# Patient Record
Sex: Female | Born: 1980
Health system: Southern US, Community
[De-identification: ages and names within clinical notes are randomized; demographics above are authoritative.]

## PROBLEM LIST (undated history)

## (undated) DIAGNOSIS — R51 Headache: Secondary | ICD-10-CM

## (undated) DIAGNOSIS — IMO0002 Reserved for concepts with insufficient information to code with codable children: Secondary | ICD-10-CM

## (undated) DIAGNOSIS — T7840XA Allergy, unspecified, initial encounter: Secondary | ICD-10-CM

## (undated) DIAGNOSIS — R87619 Unspecified abnormal cytological findings in specimens from cervix uteri: Secondary | ICD-10-CM

## (undated) HISTORY — PX: UPPER GASTROINTESTINAL ENDOSCOPY: SHX188

## (undated) HISTORY — DX: Unspecified abnormal cytological findings in specimens from cervix uteri: R87.619

## (undated) HISTORY — PX: UMBILICAL HERNIA REPAIR: SHX196

## (undated) HISTORY — DX: Allergy, unspecified, initial encounter: T78.40XA

## (undated) HISTORY — DX: Reserved for concepts with insufficient information to code with codable children: IMO0002

## (undated) HISTORY — PX: OTHER SURGICAL HISTORY: SHX169

## (undated) HISTORY — PX: HERNIA REPAIR: SHX51

## (undated) HISTORY — DX: Headache: R51

---

## 2005-11-15 ENCOUNTER — Other Ambulatory Visit: Admission: RE | Admit: 2005-11-15 | Discharge: 2005-11-15 | Payer: Self-pay | Admitting: Gynecology

## 2006-02-25 ENCOUNTER — Encounter: Payer: Self-pay | Admitting: Family Medicine

## 2006-07-19 ENCOUNTER — Emergency Department (HOSPITAL_COMMUNITY): Admission: EM | Admit: 2006-07-19 | Discharge: 2006-07-19 | Payer: Self-pay | Admitting: Emergency Medicine

## 2006-09-21 ENCOUNTER — Ambulatory Visit: Payer: Self-pay | Admitting: Family Medicine

## 2006-11-27 ENCOUNTER — Other Ambulatory Visit: Admission: RE | Admit: 2006-11-27 | Discharge: 2006-11-27 | Payer: Self-pay | Admitting: Gynecology

## 2006-12-05 ENCOUNTER — Ambulatory Visit: Payer: Self-pay | Admitting: Family Medicine

## 2007-03-06 ENCOUNTER — Encounter: Payer: Self-pay | Admitting: Family Medicine

## 2007-04-10 ENCOUNTER — Ambulatory Visit: Payer: Self-pay | Admitting: Family Medicine

## 2007-04-11 LAB — CONVERTED CEMR LAB
ALT: 16 units/L (ref 0–35)
AST: 19 units/L (ref 0–37)
Alkaline Phosphatase: 46 units/L (ref 39–117)
BUN: 15 mg/dL (ref 6–23)
Bilirubin, Direct: 0.1 mg/dL (ref 0.0–0.3)
CO2: 27 meq/L (ref 19–32)
Calcium: 9.5 mg/dL (ref 8.4–10.5)
Chloride: 105 meq/L (ref 96–112)
Cholesterol: 164 mg/dL (ref 0–200)
GFR calc Af Amer: 110 mL/min
Glucose, Bld: 88 mg/dL (ref 70–99)
Total CHOL/HDL Ratio: 2.5
Total Protein: 7.7 g/dL (ref 6.0–8.3)

## 2007-07-16 ENCOUNTER — Ambulatory Visit: Payer: Self-pay | Admitting: Family Medicine

## 2007-07-18 ENCOUNTER — Telehealth: Payer: Self-pay | Admitting: Family Medicine

## 2007-07-19 ENCOUNTER — Encounter: Payer: Self-pay | Admitting: Family Medicine

## 2007-07-19 DIAGNOSIS — R519 Headache, unspecified: Secondary | ICD-10-CM | POA: Insufficient documentation

## 2007-07-19 DIAGNOSIS — R51 Headache: Secondary | ICD-10-CM | POA: Insufficient documentation

## 2007-07-19 DIAGNOSIS — J309 Allergic rhinitis, unspecified: Secondary | ICD-10-CM | POA: Insufficient documentation

## 2007-07-19 DIAGNOSIS — R87619 Unspecified abnormal cytological findings in specimens from cervix uteri: Secondary | ICD-10-CM

## 2008-01-20 ENCOUNTER — Inpatient Hospital Stay (HOSPITAL_COMMUNITY): Admission: AD | Admit: 2008-01-20 | Discharge: 2008-01-22 | Payer: Self-pay | Admitting: Obstetrics and Gynecology

## 2008-01-20 ENCOUNTER — Encounter (INDEPENDENT_AMBULATORY_CARE_PROVIDER_SITE_OTHER): Payer: Self-pay | Admitting: Obstetrics and Gynecology

## 2009-12-16 ENCOUNTER — Ambulatory Visit: Payer: Self-pay | Admitting: Family Medicine

## 2009-12-30 ENCOUNTER — Ambulatory Visit: Payer: Self-pay | Admitting: Family Medicine

## 2010-01-05 ENCOUNTER — Telehealth: Payer: Self-pay | Admitting: Family Medicine

## 2010-01-27 ENCOUNTER — Ambulatory Visit: Payer: Self-pay | Admitting: Family Medicine

## 2010-01-27 DIAGNOSIS — R5381 Other malaise: Secondary | ICD-10-CM | POA: Insufficient documentation

## 2010-01-27 DIAGNOSIS — R5383 Other fatigue: Secondary | ICD-10-CM

## 2010-01-27 DIAGNOSIS — R110 Nausea: Secondary | ICD-10-CM

## 2010-01-28 LAB — CONVERTED CEMR LAB
BUN: 12 mg/dL (ref 6–23)
Basophils Absolute: 0 10*3/uL (ref 0.0–0.1)
Bilirubin, Direct: 0.1 mg/dL (ref 0.0–0.3)
Chloride: 108 meq/L (ref 96–112)
Creatinine, Ser: 0.8 mg/dL (ref 0.4–1.2)
GFR calc non Af Amer: 91.55 mL/min (ref >60)
Glucose, Bld: 67 mg/dL — ABNORMAL LOW (ref 70–99)
HCT: 40.9 % (ref 36.0–46.0)
Lymphs Abs: 1.7 10*3/uL (ref 0.7–4.0)
MCV: 91.9 fL (ref 78.0–100.0)
Monocytes Absolute: 0.3 10*3/uL (ref 0.1–1.0)
Neutrophils Relative %: 60.2 % (ref 43.0–77.0)
Platelets: 394 10*3/uL (ref 150.0–400.0)
Potassium: 4.1 meq/L (ref 3.5–5.1)
RDW: 12.9 % (ref 11.5–14.6)
TSH: 2.36 microintl units/mL (ref 0.35–5.50)
Total Bilirubin: 0.6 mg/dL (ref 0.3–1.2)

## 2010-02-03 ENCOUNTER — Encounter: Payer: Self-pay | Admitting: Family Medicine

## 2010-02-16 ENCOUNTER — Telehealth: Payer: Self-pay | Admitting: Family Medicine

## 2010-06-09 ENCOUNTER — Ambulatory Visit: Payer: Self-pay | Admitting: Family Medicine

## 2010-06-09 DIAGNOSIS — M62838 Other muscle spasm: Secondary | ICD-10-CM

## 2010-06-10 ENCOUNTER — Ambulatory Visit: Payer: Self-pay | Admitting: Unknown Physician Specialty

## 2010-06-10 ENCOUNTER — Encounter: Payer: Self-pay | Admitting: Family Medicine

## 2010-06-13 ENCOUNTER — Encounter: Payer: Self-pay | Admitting: Family Medicine

## 2010-06-21 ENCOUNTER — Ambulatory Visit: Payer: Self-pay | Admitting: Unknown Physician Specialty

## 2010-06-28 ENCOUNTER — Ambulatory Visit: Payer: Self-pay | Admitting: Unknown Physician Specialty

## 2010-08-28 NOTE — L&D Delivery Note (Signed)
Delivery Note Pt progressed rapidly to complete dilation after epidural and pushed well. At 12:45 PM a healthy female was delivered via  (Presentation: ; Occiput Anterior).  APGAR: 9, 9; weight 8 lb 5 oz (3771 g).   Placenta status: Intact, Spontaneous.  Cord: 3 vessels with the following complications: .   Anesthesia: Epidural  Episiotomy: None Lacerations: 2nd degree Suture Repair: 3.0 vicryl rapide Est. Blood Loss (mL): 300  Mom to postpartum.  Baby to nursery-stable.  Teresa Parrish 05/22/2011, 1:12 PM

## 2010-09-27 NOTE — Procedures (Signed)
Summary: Upper GI Endoscopy by Dr.[facility]  Upper GI Endoscopy by Dr.[facility]   Imported By: Beau Fanny 06/15/2010 16:30:56  _____________________________________________________________________  External Attachment:    Type:   Image     Comment:   External Document

## 2010-09-27 NOTE — Consult Note (Signed)
Summary: [facility]  [facility]   Imported By: Lanelle Bal 04/05/2010 09:26:05  _____________________________________________________________________  External Attachment:    Type:   Image     Comment:   External Document

## 2010-09-27 NOTE — Assessment & Plan Note (Signed)
Summary: NAUSEA/CLE   Vital Signs:  Patient profile:   30 year old female Weight:      117.75 pounds BMI:     18.79 Temp:     98.2 degrees F oral Pulse rate:   88 / minute Pulse rhythm:   regular BP sitting:   102 / 72  (left arm) Cuff size:   regular  Vitals Entered By: Sydell Axon LPN (December 16, 2009 2:37 PM) CC: Headaches in the morning and nausea, had taken a pregnancy test which was negative   History of Present Illness: Pt here for nausea for the last month. Sometimes after eating, sometimes later. Actually has been happening 3-4 mos, at first occas, now regularly. Got dizzy Sat and decided to call. No event related temporally that she can remember. Sxs actually upset stomach. No diarrhea, no constipation. She did pregnancy test yesterday, was neg. It sometimes is right after eating, sometimes 1-2 hrs later. Seems to have no relation to what is eaten.  No problems in the family with nausea. Not on any medications. Under no significant stress. 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 Has some headaches in the morning...have been bad, typically goes away after an hour, then eats and then nausea.  Did not have brfst this AM and didn't get nausea today. Sister who is a Teacher, early years/pre wonders if pt having reflux. Then pt says not stomach pain just uneasy feeling in upper abdomen.  Problems Prior to Update: 1)  Abnormal Pap Smear  (ICD-795.00) 2)  Headache  (ICD-784.0) 3)  Allergic Rhinitis  (ICD-477.9) 4)  Uri  (ICD-465.9) 5)  Screening For Lipoid Disorders  (ICD-V77.91)  Medications Prior to Update: 1)  Bl Prenatal Vitamins   Tabs (Prenatal Vit-Fe Fumarate-Fa) .... Take 1 Tablet By Mouth Once A Day 2)  Amoxicillin 500 Mg  Caps (Amoxicillin) .... 2 Tab By Mouth Two Times A Day X 10 Days 3)  Clarinex 5 Mg  Tabs (Desloratadine) .... Take 1 Tablet By Mouth Once A Day  Allergies: No Known Drug Allergies  Physical  Exam  General:  Well-developed,well-nourished, thin in no acute distress; alert,appropriate and cooperative throughout examination Head:  Normocephalic and atraumatic without obvious abnormalities. Sinuse NT. Eyes:  No corneal or conjunctival inflammation noted. EOMI. Perrla. Vision grossly normal. Ears:  External ear exam shows no significant lesions or deformities.  Otoscopic examination reveals clear canals, tympanic membranes are intact bilaterally without bulging, retraction, inflammation or discharge. Hearing is grossly normal bilaterally. Nose:  External nasal examination shows no deformity or inflammation. Nasal mucosa are pink and moist without lesions or exudates. Mouth:  Oral mucosa and oropharynx without lesions or exudates.  Teeth in good repair. Lungs:  Normal respiratory effort, chest expands symmetrically. Lungs are clear to auscultation, no crackles or wheezes. Heart:  Normal rate and regular rhythm. S1 and S2 normal without gallop, murmur, click, rub or other extra sounds. Abdomen:  Bowel sounds positive,abdomen soft and non-tender without masses, organomegaly or hernias noted. Significantly tympanitic in upper quadrants bilat , less so in RLQ.   Impression & Recommendations:  Problem # 1:  NAUSEA (ICD-787.02) Assessment New  Question of frank nausea or dyspepsia without vomiting, diarrhea or other GI complaint. Avoid milk and milk products, avoid nicotine, caffiene, tomato products and spicy foods. Eat smaller meals regularly. Don't eat within 3 hrs of going to bed. RTC 2 weeks to reassess. 30 mins spent with pt. The following medications were removed from  the medication list:    Clarinex 5 Mg Tabs (Desloratadine) .Marland Kitchen... Take 1 tablet by mouth once a day  Complete Medication List: 1)  Bl Prenatal Vitamins Tabs (Prenatal vit-fe fumarate-fa) .... Take 1 tablet by mouth once a day  Patient Instructions: 1)  RTC 2 weeks.  Current Allergies (reviewed today): No known  allergies

## 2010-09-27 NOTE — Progress Notes (Signed)
Summary: omeprazole  Phone Note Call from Patient Call back at Home Phone [phone]   Caller: Patient Call For: Kerby Nora MD Summary of Call: Patient is asking for a rx for Omeprazole 40mg  [facility] [place]. She says that it is less expensive through with rx.  Initial call taken by: Melody Comas,  Jan 05, 2010 8:46 AM  Follow-up for Phone Call        Patient notified of rx.  Follow-up by: Melody Comas,  Jan 05, 2010 9:10 AM    New/Updated Medications: OMEPRAZOLE 40 MG CPDR (OMEPRAZOLE) one tab by mouth 45 mins prior to brfst. Prescriptions: OMEPRAZOLE 40 MG CPDR (OMEPRAZOLE) one tab by mouth 45 mins prior to brfst.  #30 x 1   Entered and Authorized by:   Shaune Leeks MD   Signed by:   Shaune Leeks MD on 01/05/2010   Method used:   [facility] [phone]* (retail)       [address]       [place], [place]  [address]       Ph: [phone]       Fax: [phone]   RxID:   [id number]

## 2010-09-27 NOTE — Assessment & Plan Note (Signed)
Summary: 2WK FOLLOW UP / LFW   Vital Signs:  Patient profile:   30 year old female Weight:      115.75 pounds Temp:     98.7 degrees F oral Pulse rate:   72 / minute Pulse rhythm:   regular BP sitting:   112 / 78  (left arm) Cuff size:   regular  Vitals Entered By: Sydell Axon LPN (Dec 30, 1476 12:15 PM) CC: 2 Week follow-up on nausea   History of Present Illness: Pt here for 2 week followup of nausea and dyspepsia without vomiting that I asked her to avoid milk and milk products for, which she did and improved until she liberalized. She now continues to have nausea, typically in the mid to late afternoon. She otherwise feels well and has no overt pain.  Problems Prior to Update: 1)  Nausea  (ICD-787.02) 2)  Abnormal Pap Smear  (ICD-795.00) 3)  Headache  (ICD-784.0) 4)  Allergic Rhinitis  (ICD-477.9) 5)  Uri  (ICD-465.9) 6)  Screening For Lipoid Disorders  (ICD-V77.91)  Medications Prior to Update: 1)  Bl Prenatal Vitamins   Tabs (Prenatal Vit-Fe Fumarate-Fa) .... Take 1 Tablet By Mouth Once A Day  Allergies: No Known Drug Allergies  Physical Exam  General:  Well-developed,well-nourished, thin in no acute distress; alert,appropriate and cooperative throughout examination Head:  Normocephalic and atraumatic without obvious abnormalities. Sinuse NT. Eyes:  No corneal or conjunctival inflammation noted. EOMI. Perrla. Vision grossly normal. Ears:  External ear exam shows no significant lesions or deformities.  Otoscopic examination reveals clear canals, tympanic membranes are intact bilaterally without bulging, retraction, inflammation or discharge. Hearing is grossly normal bilaterally. Nose:  External nasal examination shows no deformity or inflammation. Nasal mucosa are pink and moist without lesions or exudates. Mouth:  Oral mucosa and oropharynx without lesions or exudates.  Teeth in good repair. Lungs:  Normal respiratory effort, chest expands symmetrically. Lungs are  clear to auscultation, no crackles or wheezes. Heart:  Normal rate and regular rhythm. S1 and S2 normal without gallop, murmur, click, rub or other extra sounds. Abdomen:  Bowel sounds positive,abdomen soft and non-tender without masses, organomegaly or hernias noted. Less  tympanitic.   Impression & Recommendations:  Problem # 1:  NAUSEA (ICD-787.02) Assessment Improved  Slightly better but was much better without most any milk products. "Do you know how hard it is to avoid ALL milk products? Suggestions per instructions. Discussed symptom control.   Complete Medication List: 1)  Bl Prenatal Vitamins Tabs (Prenatal vit-fe fumarate-fa) .... Take 1 tablet by mouth once a day 2)  Calcium 500 Mg Tabs (Calcium) .... Take one by mouth daily  Other Orders: TD Toxoids IM 7 YR + (29562) Admin 1st Vaccine (13086)  Patient Instructions: 1)  Avoid milk products for two weeks. If no help, avoid other products family is allergic to 2 weeks each. Marland Kitchen 2)  Start Omeprazole 40mg  45 mins after brfst. Do for 2 mos if helps. If not, try pre brfst and supper. If fails, RTC for GI referral. 3)  Tdap today.  Current Allergies (reviewed today): No known allergies    Tetanus/Td Immunization History:    Tetanus/Td # 1:  Td (12/30/2009)  Tetanus/Td Vaccine    Vaccine Type: Td    Site: left deltoid    Mfr: GlaxoSmithKline    Dose: 0.5 ml    Route: IM    Given by: Sydell Axon LPN    Exp. Date: 11/20/2011    Lot #:  [id number]    VIS given: 07/16/07 version given Dec 30, 2009.

## 2010-09-27 NOTE — Assessment & Plan Note (Signed)
Summary: NAUSEA/CLE   Vital Signs:  Patient profile:   30 year old female Height:      66.5 inches Weight:      114.50 pounds BMI:     18.27 Temp:     98.3 degrees F oral Pulse rate:   80 / minute Pulse rhythm:   regular BP sitting:   104 / 76  (right arm) Cuff size:   regular  Vitals Entered By: Linde Gillis CMA Duncan Dull) (January 27, 2010 10:43 AM) CC: nausea   History of Present Illness: Pt here for followup of nausea and dyspepsia without vomiting .  She now continues to have nausea, typically in the mid to late afternoon. She otherwise feels well and has no overt pain.  Was seeing Dr. Hetty Ely for this last month. Placed her on omeprazole and asked her to avoid dairy products.  She felt like symptoms have improved mildly but not by much.  This has been going on since December. Assocated with her postpradial nausea is non specific abdominal discomfort/epigastric burning, fatigue, and anxiety.  She is not more stressed in general but gets very anxious when she thinks about what could be wrong with her, becomes tearful and short of breath. NO SI or HI.  No changes in her bowels.  No constipation, diarrhea, blood in stool.  Current Medications (verified): 1)  Calcium 500 Mg Tabs (Calcium) .... Take One By Mouth Daily 2)  Omeprazole 40 Mg Cpdr (Omeprazole) .... One Tab By Mouth 45 Mins Prior To Brfst. 3)  Alprazolam 0.25 Mg Tabs (Alprazolam) .Marland Kitchen.. 1 Tab By Mouth Three Times A Day As Needed Anxiety  Allergies (verified): No Known Drug Allergies  Past History:  Past Medical History: Last updated: 07/19/2007 Unremarkable Allergic rhinitis Headache  Past Surgical History: Last updated: 07/19/2007 2006    CT scan of head (-) Age 24 Hernia, umbilical repair 2005     Colposcopy (-) biopsy  Family History: Last updated: 07/19/2007 Father: Alive 63, healthy Mother: Alive at 2, high chol. 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 Father: Alive 66, healthy Mother: Alive at 67, high chol. Siblings: 1 sister, healthy No MI < 3 Breast CA:  Maternal aunt, age 26's  Social History: Reviewed history from 07/19/2007 and no changes required. 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 ENT:  Denies difficulty swallowing. CV:  Denies chest pain or discomfort. Resp:  Denies shortness of breath. GI:  Complains of abdominal pain and nausea; denies bloody stools, change in bowel habits, vomiting, and yellowish skin color. GU:  Denies discharge and dysuria. MS:  Denies joint pain, joint redness, and joint swelling. Psych:  Complains of anxiety and easily tearful; denies depression, suicidal thoughts/plans, thoughts of violence, and unusual visions or  sounds.  Physical Exam  General:  Well-developed,well-nourished, thin in no acute distress; alert,appropriate and cooperative throughout examination Lungs:  Normal respiratory effort, chest expands symmetrically. Lungs are clear to auscultation, no crackles or wheezes. Heart:  Normal rate and regular rhythm. S1 and S2 normal without gallop, murmur, click, rub or other extra sounds. Abdomen:  Bowel sounds positive,abdomen soft and  non-tender without masses, organomegaly or hernias noted. Psych:  tearful, anxious when discussing her symptoms.   Impression & Recommendations:  Problem # 1:  NAUSEA (ICD-787.02) Assessment Deteriorated Differential is wide and likely multifactorial.  Does seem consistent with dyspepsia/esophagitis but given that symptoms have lasted so long and not improved much with omeprazole, I do think this warrants a GI referral to rule out other causes such as PUD. Associated with fatigue as well, will check labs such as BMET, CBC, TSH, hepatic panel. Given prescription for short term Xanax until we can figure out cause.  Anxiety seems to be playing a component as well. Orders: Gastroenterology Referral (GI)  Problem # 2:  FATIGUE (ICD-780.79)  Orders: Venipuncture ([address]) TLB-BMP (Basic Metabolic Panel-BMET) (80048-METABOL) TLB-CBC Platelet - w/Differential (85025-CBCD) TLB-Hepatic/Liver Function Pnl (80076-HEPATIC) TLB-TSH (Thyroid Stimulating Hormone) (84443-TSH)  Complete Medication List: 1)  Calcium 500 Mg Tabs (Calcium) .... Take one by mouth daily 2)  Omeprazole 40 Mg Cpdr (Omeprazole) .... One tab by mouth 45 mins prior to brfst. 3)  Alprazolam 0.25 Mg Tabs (Alprazolam) .Marland Kitchen.. 1 tab by mouth three times a day as needed anxiety  Patient Instructions: 1)  Please stop by to see Aram Beecham on your way out to set up your GI referral. Prescriptions: ALPRAZOLAM 0.25 MG TABS (ALPRAZOLAM) 1 tab by mouth three times a day as needed anxiety  #30 x 0   Entered and Authorized by:   Ruthe Mannan MD   Signed by:   Ruthe Mannan MD on 01/27/2010   Method used:   Print then Give to Patient   RxID:   [id number]   Current Allergies (reviewed today): No known allergies

## 2010-09-27 NOTE — Progress Notes (Signed)
Summary: needs written script for omeprazole  Phone Note Refill Request Call back at Home Phone [phone] Message from:  Patient  Refills Requested: Medication #1:  OMEPRAZOLE 40 MG CPDR one tab by mouth 45 mins prior to brfst. Pt needs written script for this to mail to mail order pharmacy.  I sent script electronically and then pt said that we need to put certain information on script, such as her BCBS number.  I told her we cant do that electronically, that she can pick up written script and write that info in herself.  Please call when ready.  Initial call taken by: Lowella Petties CMA,  February 16, 2010 3:27 PM  Follow-up for Phone Call        patient advised rx ready for pick up Follow-up by: Benny Lennert CMA Duncan Dull),  February 17, 2010 8:38 AM    Prescriptions: OMEPRAZOLE 40 MG CPDR (OMEPRAZOLE) one tab by mouth 45 mins prior to brfst.  #90 x 2   Entered and Authorized by:   [place] Beat MD   Signed by:   [place] Beat MD on 02/16/2010   Method used:   Print then Give to Patient   RxID:   [id number]

## 2010-09-27 NOTE — Procedures (Signed)
Summary: Colonoscopy by Dr.Paul [facility]  Colonoscopy by Dr.Paul Oh,[facility]   Imported By: Beau Fanny 06/15/2010 16:36:02  _____________________________________________________________________  External Attachment:    Type:   Image     Comment:   External Document

## 2010-09-27 NOTE — Assessment & Plan Note (Signed)
Summary: HA/CLE   Vital Signs:  Patient profile:   30 year old female Height:      66.5 inches Weight:      113 pounds BMI:     18.03 Temp:     98.3 degrees F oral Pulse rate:   72 / minute Pulse rhythm:   regular BP sitting:   110 / 78  (right arm) Cuff size:   regular  Vitals Entered By: Linde Gillis CMA Duncan Dull) (June 09, 2010 12:20 PM) CC: headaches   History of Present Illness: Pt here for 2 months of headaches. Headaches occur daily, usually in back of head, sometimes frontal.  No associated with photophobia but does sometimes occur around her eyes and can "see spots."  No focal neurological deficts.  Headaches are dull and constant.  Takes Tylenol for them because being follow for epigastric pain and nausea and told not to take NSAIDs  Under tremendous stress, has been dealing with this daily nausea and epigastric discomfort.  Scheduled for endoscopy tomorrow and is very concerned there is something very wrong with her.    No changes in her bowels.  No constipation, diarrhea, blood in stool.  Weight stable since June.  Current Medications (verified): 1)  Cyclobenzaprine Hcl 10 Mg  Tabs (Cyclobenzaprine Hcl) .Marland Kitchen.. 1 By Mouth 2 Times Daily As Needed For Headache or Back Pain.  Allergies (verified): No Known Drug Allergies  Past History:  Past Medical History: Last updated: 07/19/2007 Unremarkable Allergic rhinitis Headache  Past Surgical History: Last updated: 07/19/2007 2006    CT scan of head (-) Age 30 Hernia, umbilical repair 2005     Colposcopy (-) biopsy  Family History: Last updated: 07/19/2007 Father: Alive 37, healthy Mother: Alive at 51, high chol. 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 Eyes:  Denies light sensitivity and vision loss-both eyes. ENT:  Denies decreased hearing. GI:  Complains of abdominal pain and nausea; denies bloody stools, change in bowel habits, and vomiting. Neuro:  Complains of headaches; denies inability to speak, seizures, sensation of room spinning, tingling, tremors, visual disturbances, and weakness.  Physical Exam  General:  Well-developed,well-nourished, thin in no acute distress; alert,appropriate and cooperative throughout examination Head:  Normocephalic and atraumatic without obvious abnormalities. Sinuse NT. Eyes:  No corneal or conjunctival inflammation noted. EOMI. Perrla. Vision grossly normal. Mouth:  Oral mucosa and oropharynx without lesions or exudates.  Teeth in good repair. Neck:  trapezius muscle very firm and TTP Lungs:  Normal respiratory effort, chest expands symmetrically. Lungs are clear to auscultation, no crackles or wheezes. Heart:  Normal rate and regular rhythm. S1 and S2 normal without gallop, murmur, click, rub or other extra sounds. Neurologic:  No cranial nerve deficits noted. Station and gait are normal. Plantar reflexes are down-going bilaterally. DTRs are symmetrical throughout. Sensory, motor and coordinative functions appear intact. Psych:  tearful, anxious when discussing her symptoms.   Impression & Recommendations:  Problem # 1:  HEADACHE (ICD-784.0) Assessment New Most consistent with tension headache given history and  physical exam findings.  Not tyipcal of migraine although ahd some "spots" in her vision once which resolved.  Has muscle tightness/spasm of trapezius muscle which is likely contributing.  See below.    Problem # 2:  MUSCLE SPASM, TRAPEZIUS  (ICD-728.85) Assessment: New Advised massage therapy, flexeril as needed.  Hopefully once she has results from endoscopy, stress level will improve.    Complete Medication List: 1)  Cyclobenzaprine Hcl 10 Mg Tabs (Cyclobenzaprine hcl) .Marland Kitchen.. 1 by mouth 2 times daily as needed for headache or back pain.  Patient Instructions: 1)  Massage therapy. 2)  Good luck tomorrow, let me know how it goes. Prescriptions: CYCLOBENZAPRINE HCL 10 MG  TABS (CYCLOBENZAPRINE HCL) 1 by mouth 2 times daily as needed for headache or back pain.  #30 x 0   Entered and Authorized by:   Ruthe Mannan MD   Signed by:   Ruthe Mannan MD on 06/09/2010   Method used:   [facility]        [facility] [phone]* (retail)       [address]       [place], [place]  [address]       Ph: [phone]       Fax: [phone]   RxID:   [id number]   Current Allergies (reviewed today): No known allergies

## 2010-10-12 ENCOUNTER — Ambulatory Visit (INDEPENDENT_AMBULATORY_CARE_PROVIDER_SITE_OTHER): Payer: BC Managed Care – PPO | Admitting: Family Medicine

## 2010-10-12 ENCOUNTER — Encounter: Payer: Self-pay | Admitting: Family Medicine

## 2010-10-12 DIAGNOSIS — J018 Other acute sinusitis: Secondary | ICD-10-CM | POA: Insufficient documentation

## 2010-10-19 NOTE — Assessment & Plan Note (Signed)
Summary: COUGH,ST/CLE   BCBS   Vital Signs:  Patient profile:   30 year old female Height:      66.5 inches Weight:      120.25 pounds BMI:     19.19 Temp:     98.2 degrees F oral Pulse rate:   72 / minute Pulse rhythm:   regular BP sitting:   110 / 80  (right arm) Cuff size:   regular  Vitals Entered By: Linde Gillis CMA Duncan Dull) (October 12, 2010 12:17 PM) CC: cough, sore throat   History of Present Illness: 30 yo 7 1/[redacted] weeks pregnant here for URI symptoms.  Started 3 weeks ago with cold symptoms- sneezing, runny nose. Thought she was getting better but a few days ago, worsening dry cough and sinus pressure. Felt feverish last night. No wheezing, CP, SOB. No abdominal pain or rashes.    Current Medications (verified): 1)  Keflex 500 Mg Caps (Cephalexin) .Marland Kitchen.. 1 Tab By Mouth Two Times A Day X 7 Days 2)  Cheratussin Ac 100-10 Mg/62ml Syrp (Guaifenesin-Codeine) .... 5 Ml By Mouth Two Times A Day As Needed Cough  Allergies (verified): No Known Drug Allergies  Past History:  Past Medical History: Last updated: 07/19/2007 Unremarkable Allergic rhinitis Headache  Past Surgical History: Last updated: 07/19/2007 2006    CT scan of head (-) Age 10 Hernia, umbilical repair 2005     Colposcopy (-) biopsy  Family History: Last updated: 07/19/2007 Father: Alive 49, healthy Mother: Alive at 55, high chol. 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 ENT:  Complains of nasal congestion,  postnasal drainage, sinus pressure, and sore throat. CV:  Denies chest pain or discomfort. Resp:  Complains of cough; denies shortness of breath, sputum productive, and wheezing.  Physical Exam  General:  Well-developed,well-nourished, thin in no acute distress; alert,appropriate and cooperative throughout examination VSS sounds congested, non toxic appearing Nose:  mucosal erythema.   frontal sinsues TTP Mouth:  pharyngeal erythema.   Lungs:  Normal respiratory effort, chest expands symmetrically. Lungs are clear to auscultation, no crackles or wheezes. Heart:  Normal rate and regular rhythm. S1 and S2 normal without gallop, murmur, click, rub or other extra sounds. Psych:  Cognition and judgment appear intact. Alert and cooperative with normal attention span and concentration. No apparent delusions, illusions, hallucinations   Impression & Recommendations:  Problem # 1:  OTHER ACUTE SINUSITIS (ICD-461.8) Assessment New likely bacterial given duration and progression of symptoms. limited for abx we can use due to pregnancy. Keflex 500 mg two times a day x 7d days. Cheratussin as needed cough. Her updated medication list for this problem includes:    Keflex 500 Mg Caps (Cephalexin) .Marland Kitchen... 1 tab by mouth two times a day x 7 days    Cheratussin Ac 100-10 Mg/33ml Syrp (Guaifenesin-codeine) .Marland KitchenMarland KitchenMarland KitchenMarland Kitchen 5 ml by mouth two times a day as needed cough  Complete Medication List: 1)  Keflex 500 Mg Caps (Cephalexin) .Marland Kitchen.. 1 tab by mouth two times a day x 7 days 2)  Cheratussin Ac 100-10 Mg/48ml Syrp (Guaifenesin-codeine) .... 5 ml by mouth two times a day as needed cough Prescriptions: CHERATUSSIN AC 100-10 MG/5ML SYRP (GUAIFENESIN-CODEINE) 5 ml by mouth two times a day as needed cough  #5 ounces x 0   Entered and Authorized by:   Ruthe Mannan MD   Signed by:   Ruthe Mannan MD on 10/12/2010   Method used:   Print then Give to Patient   RxID:   [id number] KEFLEX 500 MG CAPS (CEPHALEXIN) 1 tab by  mouth two times a day x 7 days  #14 x 0   Entered and Authorized by:   Ruthe Mannan MD   Signed by:   Ruthe Mannan MD on 10/12/2010   Method used:   Electronically to        [facility] [phone]* (retail)       [address]       [place], [place]  [address]       Ph: [phone]       Fax: [phone]   RxID:   [id number]    Orders Added: 1)  Est. Patient Level III [34742]    Prior Medications (reviewed today): None Current Allergies (reviewed today): No known allergies

## 2010-11-03 LAB — GC/CHLAMYDIA PROBE AMP, GENITAL: Gonorrhea: NEGATIVE

## 2010-11-03 LAB — RPR: RPR: NONREACTIVE

## 2010-11-03 LAB — ABO/RH: RH Type: POSITIVE

## 2010-11-03 LAB — ANTIBODY SCREEN: Antibody Screen: NEGATIVE

## 2010-11-03 LAB — HIV ANTIBODY (ROUTINE TESTING W REFLEX): HIV: NONREACTIVE

## 2011-01-10 NOTE — Discharge Summary (Signed)
NAMERICHARD, HOLZ NO.:  [id number]   MEDICAL RECORD NO.:  [id number]          PATIENT TYPE:  INP   LOCATION:  9128                          FACILITY:  [facility]   PHYSICIAN:  Malachi Pro. Ambrose Mantle, M.D. DATE OF BIRTH:  May 05, 1981   DATE OF ADMISSION:  01/20/2008  DATE OF DISCHARGE:  01/22/2008                               DISCHARGE SUMMARY   A 30 year old white married female, para 0, gravida 1, EDC February 01, 2008, admitted with premature rupture of membranes at 1:45 a.m. on Jan 20, 2008.  Blood group and type A+ with a negative antibody, nonreactive  serology, rubella immune, hepatitis B surface antigen negative, HIV  negative, GC and chlamydia negative, declined quad screen, 1-hour  glucola 97, and group B strep positive.  Vaginal ultrasound on June 13, 2007, gestational age [redacted] weeks 6 days, [facility] January 31, 2008, or February 01, 2008.  Ultrasound July 04, 2007, crown-rump length 2.93 cm, [facility] February 01, 2008.  Prenatal care was uncomplicated.  At 1:45 a.m. on the day of  admission, the patient had spontaneous rupture of membranes with clear  fluid at home.  She came here at approximately 3:30 a.m.  Labor did not  begin.  Pitocin was begun at approximately 7:00 a.m.   PAST MEDICAL HISTORY:  No known allergies.  Operations:  In 1992,  umbilical hernia repair.  No illnesses.  Alcohol, tobacco, and drugs  none.   FAMILY HISTORY:  Maternal aunt with breast cancer.  Maternal grandmother  with lymphoma.   On admission, her vital signs were normal.  Heart and lungs were normal.  The abdomen was soft.  Fundal height was 36 cm on Jan 10, 2008.  Fetal  heart tones were normal.  Cervix was 2+ cm, 70%, vertex at -2.  The  patient was placed on penicillin as well as Pitocin by 9:05 a.m., the  contractions were very painful, every 2 minutes.  By the nurse's exam,  the cervix was 3 cm, 70%, vertex at a -1 to -2.  The patient refused  Stadol and wanted an epidural.  She received  an epidural by 9:45 a.m.,  the cervix was 3 cm, 70%, vertex at a -2, contractions were every 2  minutes.  The patient then progressed to full dilatation and pushed  well.  She delivered spontaneously after vacuum assistance was applied.  a living female infant 7 pounds 11 ounces with Apgars of 8 at 1 and 9 at 5  minutes.  Dr. Ambrose Mantle was in attendance.  There was 1 loop of nuchal  cord.  The placenta was intact.  The second-degree midline laceration  was repaired with 3-0 Vicryl.  Rectal was negative.  Uterus was normal.  First, 1% Xylocaine was used for the repair.  Vacuum was applied for the  patient exhaustion with approximately 8 cm of caput showing.  After more  descent and inevitable delivery, the vacuum was removed and the patient  delivered spontaneously.  Blood loss was about 400 mL.  Postpartum, the  patient did well and was discharged on the second  postpartum day.  Initial hemoglobin 39, hematocrit 40.4, white count 10,400, and platelet  count 304,000.  Followup hemoglobin 10.8 and hematocrit 30.5.  RPR was  nonreactive.   FINAL DIAGNOSES:  Intrauterine pregnancy 38 weeks, delivered vertex,  premature rupture of membranes, positive group B strep.   OPERATION:  Vacuum-assisted vaginal delivery and repair of second-degree  midline laceration.   FINAL CONDITION:  Improved.   INSTRUCTIONS:  Include our regular discharge instruction booklet.  Motrin 600 mg, 30 tablets, 1 every 6 hours as needed for pain and  Percocet 5/325, 30 tablets, 1 every 4-6 hours as needed for pain were  given at discharge.  The patient is advised to return in 6 weeks for  followup examination.      Malachi Pro. Ambrose Mantle, M.D.  Electronically Signed     TFH/MEDQ  D:  01/22/2008  T:  01/22/2008  Job:  [id number]

## 2011-01-13 NOTE — Assessment & Plan Note (Signed)
[facility]                           [place] OFFICE NOTE   SHARITA, BIENAIME                       MRN:          [id number]  DATE:09/21/2006                            DOB:          [birth year]    CHIEF COMPLAINT:  30 year old white female here to establish new doctor.   HISTORY OF PRESENT ILLNESS:  Ms. Deamer moved from [place]  approximately 1 year ago. She states that she has been doing well other  than frequent infections over the winter that she was seen in Urgent  Care for. She states that she now has constant post-nasal drip, a  slightly sore throat, and no sneezing, but itchy eyes. She tried  Claritin-D over-the-counter, but noticed no improvement. She has not  taken any allergy medicine in the past. She has never had problems with  allergies before.   REVIEW OF SYSTEMS:  Frequent headaches, no nausea, positive sensitivity  to light and sound. The headaches last 30-40 minutes and happen 1 time  per month. No aura. She uses ibuprofen p.r.n. pain. No glasses. No  hearing problems. No chest pain. No shortness of breath. No  palpitations. No nausea, vomiting, diarrhea, constipation, or rectal  bleeding.   PAST MEDICAL HISTORY:  1. Frequent headaches.  2. 2005 abnormal pap.   HOSPITALIZATIONS/SURGERIES/PROCEDURES:  1. 2006, CAT scan of head, negative.  2. Umbilical hernia repair, age 55.  3. Colposcopy, negative, no biopsy done.  4. Pap smear, 02/2006, negative.   ALLERGIES:  None.   MEDICATIONS:  1. Loestrin birth control pills daily.  2. Multivitamin daily.  3. Ibuprofen 800 mg every 8 hours, p.r.n. headache.   SOCIAL HISTORY:  No tobacco use. She drinks 1 alcoholic beverage, once a  week. No drug use. 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 She has been  married for 1 1/2 years. No verbal or physical abuse. She has no  children. She exercises 3-4 times a week, with running and using the  elliptical. She eats fruits and  vegetables. She tries to avoid fast food  and drinks lots of water.   FAMILY HISTORY:  Father alive at age 67, healthy. Mother alive at age 72  with hypercholesterolemia. She has no family history of MI before age  12. She has 1 sister who is healthy. No family history of diabetes. She  has a maternal aunt who developed breast cancer at age 80. No family  history of colon cancer or other types of cancer.   PHYSICAL EXAMINATION:  VITAL SIGNS: Height 66 1/2 inches, weight 125,  making BMI 20. Blood pressure 98/72, pulse 68, temperature 98.1.  GENERAL: Thin appearing female in no apparent distress.  HEENT: PERRLA, extraocular muscles intact, oral pharynx clear, tympanic  membranes with small amount of clear fluid bilaterally. Nares slightly  pale, turbinates bilaterally. Oral pharynx has no erythema. No  thyromegaly, no lymphadenopathy. Supraclavicular cervical.  CARDIOVASCULAR: Regular rate and rhythm, no murmurs, rubs, or gallops.  Normal PMI. 2+ peripheral pulses. No peripheral edema.  LUNGS: Clear to auscultation bilaterally. No wheezes, rales, or rhonchi.  ABDOMEN: Soft,  nontender, normal active bowel sounds. No hepatomegaly or  splenomegaly.  MUSCULOSKELETAL: Strength 5/5 in the upper and lower extremities.  NEUROLOGIC: Cranial nerves 2-12 are grossly intact. Alert and oriented  x3.  REFLEXES: Patellar 2+.   ASSESSMENT AND PLAN:  1. Allergic rhinitis: Her symptoms of constant post-nasal drip, itchy      eyes, and slight sore throat, are most likely secondary to      allergies. She will begin with Clarinex 5 mg daily, which we have      given 10 days of samples for. She will let me know if this improves      her symptoms or not. She may have developed allergies since her      move from [place] to [place].  2. Prevention: She is up-to-date with pap smear. She is unsure of her      last vaccinations, and I will obtain records from her previous      doctor in [place]. She  does not think she has ever had a      cholesterol baseline, so we will order this fasting, along with a C-      met. She was encouraged to continue to eat a healthy and varied      diet, and to get frequent exercise as she is doing. She was also      encouraged to take multivitamin, and to consider calcium 600 mg      daily. She will continue to get birth control pills from her OB-      GYN.     Kerby Nora, MD  Electronically Signed    AB/MedQ  DD: 09/21/2006  DT: 09/22/2006  Job #: [id number]

## 2011-05-02 LAB — STREP B DNA PROBE: GBS: NEGATIVE

## 2011-05-18 ENCOUNTER — Telehealth (HOSPITAL_COMMUNITY): Payer: Self-pay | Admitting: *Deleted

## 2011-05-18 ENCOUNTER — Encounter (HOSPITAL_COMMUNITY): Payer: Self-pay | Admitting: *Deleted

## 2011-05-18 NOTE — Telephone Encounter (Signed)
Preadmission screen  

## 2011-05-21 ENCOUNTER — Other Ambulatory Visit: Payer: Self-pay | Admitting: Obstetrics and Gynecology

## 2011-05-22 ENCOUNTER — Inpatient Hospital Stay (HOSPITAL_COMMUNITY)
Admission: RE | Admit: 2011-05-22 | Discharge: 2011-05-24 | DRG: 373 | Disposition: A | Discharge status: Home / Self Care | Payer: BC Managed Care – PPO | Source: Ambulatory Visit | Attending: Obstetrics and Gynecology | Admitting: Obstetrics and Gynecology

## 2011-05-22 ENCOUNTER — Encounter (HOSPITAL_COMMUNITY): Payer: Self-pay | Admitting: Anesthesiology

## 2011-05-22 ENCOUNTER — Inpatient Hospital Stay (HOSPITAL_COMMUNITY): Payer: BC Managed Care – PPO | Admitting: Anesthesiology

## 2011-05-22 ENCOUNTER — Encounter (HOSPITAL_COMMUNITY): Payer: Self-pay

## 2011-05-22 LAB — CBC
MCH: 30.5 pg (ref 26.0–34.0)
MCHC: 33.7 g/dL (ref 30.0–36.0)
MCV: 90.6 fL (ref 78.0–100.0)
Platelets: 237 10*3/uL (ref 150–400)
RBC: 4.16 MIL/uL (ref 3.87–5.11)

## 2011-05-22 MED ORDER — EPHEDRINE 5 MG/ML INJ
10.0000 mg | INTRAVENOUS | Status: DC | PRN
  Filled 2011-05-22: qty 4

## 2011-05-22 MED ORDER — OXYTOCIN 20 UNITS IN LACTATED RINGERS INFUSION - SIMPLE
1.0000 m[IU]/min | INTRAVENOUS | Status: DC
  Administered 2011-05-22: 2 m[IU]/min via INTRAVENOUS
  Filled 2011-05-22: qty 1000

## 2011-05-22 MED ORDER — LACTATED RINGERS IV SOLN: 500.0000 mL | Freq: Once | INTRAVENOUS | Status: DC

## 2011-05-22 MED ORDER — CITRIC ACID-SODIUM CITRATE 334-500 MG/5ML PO SOLN: 30.0000 mL | ORAL | Status: DC | PRN

## 2011-05-22 MED ORDER — ONDANSETRON HCL 4 MG/2ML IJ SOLN: 4.0000 mg | INTRAMUSCULAR | Status: DC | PRN

## 2011-05-22 MED ORDER — OXYTOCIN 20 UNITS IN LACTATED RINGERS INFUSION - SIMPLE: 125.0000 mL/h | Freq: Once | INTRAVENOUS | Status: DC

## 2011-05-22 MED ORDER — LACTATED RINGERS IV SOLN
INTRAVENOUS | Status: DC
  Administered 2011-05-22: 900 mL via INTRAVENOUS

## 2011-05-22 MED ORDER — OXYCODONE-ACETAMINOPHEN 5-325 MG PO TABS: 1.0000 | ORAL_TABLET | ORAL | Status: DC | PRN

## 2011-05-22 MED ORDER — ACETAMINOPHEN 325 MG PO TABS: 650.0000 mg | ORAL_TABLET | ORAL | Status: DC | PRN

## 2011-05-22 MED ORDER — DIBUCAINE 1 % RE OINT: 1.0000 "application " | TOPICAL_OINTMENT | RECTAL | Status: DC | PRN

## 2011-05-22 MED ORDER — DIPHENHYDRAMINE HCL 50 MG/ML IJ SOLN: 12.5000 mg | INTRAMUSCULAR | Status: DC | PRN

## 2011-05-22 MED ORDER — PRENATAL PLUS 27-1 MG PO TABS
1.0000 | ORAL_TABLET | Freq: Every day | ORAL | Status: DC
  Administered 2011-05-23: 1 via ORAL
  Filled 2011-05-22 (×2): qty 1

## 2011-05-22 MED ORDER — WITCH HAZEL-GLYCERIN EX PADS: 1.0000 "application " | MEDICATED_PAD | CUTANEOUS | Status: DC | PRN

## 2011-05-22 MED ORDER — OXYCODONE-ACETAMINOPHEN 5-325 MG PO TABS: 2.0000 | ORAL_TABLET | ORAL | Status: DC | PRN

## 2011-05-22 MED ORDER — FLEET ENEMA 7-19 GM/118ML RE ENEM: 1.0000 | ENEMA | RECTAL | Status: DC | PRN

## 2011-05-22 MED ORDER — SIMETHICONE 80 MG PO CHEW: 80.0000 mg | CHEWABLE_TABLET | ORAL | Status: DC | PRN

## 2011-05-22 MED ORDER — EPHEDRINE 5 MG/ML INJ
10.0000 mg | INTRAVENOUS | Status: DC | PRN
  Filled 2011-05-22 (×2): qty 4

## 2011-05-22 MED ORDER — TETANUS-DIPHTH-ACELL PERTUSSIS 5-2.5-18.5 LF-MCG/0.5 IM SUSP
0.5000 mL | Freq: Once | INTRAMUSCULAR | Status: AC
  Administered 2011-05-23: 0.5 mL via INTRAMUSCULAR
  Filled 2011-05-22: qty 0.5

## 2011-05-22 MED ORDER — LIDOCAINE HCL 1.5 % IJ SOLN
INTRAMUSCULAR | Status: DC | PRN
  Administered 2011-05-22 (×2): 5 mL via EPIDURAL

## 2011-05-22 MED ORDER — SENNOSIDES-DOCUSATE SODIUM 8.6-50 MG PO TABS
2.0000 | ORAL_TABLET | Freq: Every day | ORAL | Status: DC
  Administered 2011-05-22 – 2011-05-23 (×2): 2 via ORAL

## 2011-05-22 MED ORDER — LACTATED RINGERS IV SOLN
500.0000 mL | INTRAVENOUS | Status: DC | PRN
  Administered 2011-05-22: 1000 mL via INTRAVENOUS
  Administered 2011-05-22: 300 mL via INTRAVENOUS

## 2011-05-22 MED ORDER — ONDANSETRON HCL 4 MG PO TABS: 4.0000 mg | ORAL_TABLET | ORAL | Status: DC | PRN

## 2011-05-22 MED ORDER — ONDANSETRON HCL 4 MG/2ML IJ SOLN: 4.0000 mg | Freq: Four times a day (QID) | INTRAMUSCULAR | Status: DC | PRN

## 2011-05-22 MED ORDER — IBUPROFEN 600 MG PO TABS
600.0000 mg | ORAL_TABLET | Freq: Four times a day (QID) | ORAL | Status: DC
  Administered 2011-05-22 – 2011-05-24 (×7): 600 mg via ORAL
  Filled 2011-05-22 (×6): qty 1

## 2011-05-22 MED ORDER — FENTANYL 2.5 MCG/ML BUPIVACAINE 1/10 % EPIDURAL INFUSION (WH - ANES)
14.0000 mL/h | INTRAMUSCULAR | Status: DC
  Filled 2011-05-22: qty 60

## 2011-05-22 MED ORDER — PHENYLEPHRINE 40 MCG/ML (10ML) SYRINGE FOR IV PUSH (FOR BLOOD PRESSURE SUPPORT)
80.0000 ug | PREFILLED_SYRINGE | INTRAVENOUS | Status: DC | PRN
  Filled 2011-05-22 (×2): qty 5

## 2011-05-22 MED ORDER — PHENYLEPHRINE 40 MCG/ML (10ML) SYRINGE FOR IV PUSH (FOR BLOOD PRESSURE SUPPORT)
80.0000 ug | PREFILLED_SYRINGE | INTRAVENOUS | Status: DC | PRN
  Filled 2011-05-22: qty 5

## 2011-05-22 MED ORDER — LIDOCAINE HCL (PF) 1 % IJ SOLN
30.0000 mL | INTRAMUSCULAR | Status: DC | PRN
  Filled 2011-05-22 (×2): qty 30

## 2011-05-22 MED ORDER — IBUPROFEN 600 MG PO TABS
600.0000 mg | ORAL_TABLET | Freq: Four times a day (QID) | ORAL | Status: DC | PRN
  Filled 2011-05-22: qty 1

## 2011-05-22 MED ORDER — BENZOCAINE-MENTHOL 20-0.5 % EX AERO: 1.0000 "application " | INHALATION_SPRAY | CUTANEOUS | Status: DC | PRN

## 2011-05-22 MED ORDER — OXYTOCIN BOLUS FROM INFUSION
500.0000 mL | Freq: Once | INTRAVENOUS | Status: DC
  Filled 2011-05-22: qty 500

## 2011-05-22 MED ORDER — LANOLIN HYDROUS EX OINT: TOPICAL_OINTMENT | CUTANEOUS | Status: DC | PRN

## 2011-05-22 MED ORDER — ZOLPIDEM TARTRATE 5 MG PO TABS: 5.0000 mg | ORAL_TABLET | Freq: Every evening | ORAL | Status: DC | PRN

## 2011-05-22 MED ORDER — DIPHENHYDRAMINE HCL 25 MG PO CAPS: 25.0000 mg | ORAL_CAPSULE | Freq: Four times a day (QID) | ORAL | Status: DC | PRN

## 2011-05-22 MED ORDER — TERBUTALINE SULFATE 1 MG/ML IJ SOLN: 0.2500 mg | Freq: Once | INTRAMUSCULAR | Status: DC | PRN

## 2011-05-22 MED ORDER — FENTANYL 2.5 MCG/ML BUPIVACAINE 1/10 % EPIDURAL INFUSION (WH - ANES)
INTRAMUSCULAR | Status: DC | PRN
  Administered 2011-05-22: 14 mL/h via EPIDURAL

## 2011-05-22 NOTE — Anesthesia Procedure Notes (Addendum)
Epidural Patient location during procedure: OB Start time: 05/22/2011 10:58 AM End time: 05/22/2011 11:05 AM Reason for block: procedure for pain  Staffing Anesthesiologist: Sandrea Hughs Performed by: anesthesiologist   Preanesthetic Checklist Completed: patient identified, site marked, surgical consent, pre-op evaluation, timeout performed, IV checked, risks and benefits discussed and monitors and equipment checked  Epidural Patient position: sitting Prep: site prepped and draped and DuraPrep Patient monitoring: continuous pulse ox and blood pressure Approach: midline Injection technique: LOR air  Needle:  Needle type: Tuohy  Needle gauge: 17 G Needle length: 9 cm Needle insertion depth: 5 cm cm Catheter type: closed end flexible Catheter size: 19 Gauge Catheter at skin depth: 10 cm Test dose: negative and 1.5% lidocaine  Assessment Events: blood not aspirated, injection not painful, no injection resistance, negative IV test and no paresthesia

## 2011-05-22 NOTE — H&P (Signed)
Teresa Parrish is a 30 y.o. female G2P1001 at 39+weeks (EDD 05/26/11 by 7 week [place]) presenting for induction of labor given term status and favorable cervix.  Prenatal care completely uneventful.   History OB History    Grav Para Term Preterm Abortions TAB SAB Ect Mult Living   2 1 1       1     NSVD 7#11oz 2009  Past Medical History  Diagnosis Date  . Abnormal Pap smear         Resolved  Past Surgical History  Procedure Date  . Upper gastrointestinal endoscopy   . Umbilical hernia repair    Family History: family history includes Cancer in her maternal aunt and maternal grandmother. Social History:  reports that she has never smoked. She does not have any smokeless tobacco history on file. She reports that she does not drink alcohol or use illicit drugs.  ROS  Dilation: 2.5 Effacement (%): 50 Station: -2 Exam by:: Dr Senaida Ores AROM clear  Blood pressure 125/78, pulse 84, temperature 98.7 F (37.1 C), temperature source Oral, resp. rate 20, height 4\' 7"  (1.397 m), weight 74.844 kg (165 lb), last menstrual period 08/15/2010. Exam Physical Exam  Constitutional: She appears well-developed.  Cardiovascular: Normal rate and regular rhythm.   Respiratory: Effort normal and breath sounds normal.  GI: Soft.  Genitourinary: Vagina normal and uterus normal.  Psychiatric: She has a normal mood and affect.    Prenatal labs: ABO, Rh: A/Positive/-- (03/08 0000) Antibody: Negative (03/08 0000) Rubella: Immune (03/08 0000) RPR: Nonreactive (03/08 0000)  HBsAg: Negative (03/08 0000)  HIV: Non-reactive (03/08 0000)  GBS: Negative (09/04 0000)  Declined genetic screenings One hour glucola 114 CF negative  Assessment/Plan: Pt on pitocin and AROM performed.  Plans epidural.  Teresa Parrish 05/22/2011, 9:19 AM

## 2011-05-22 NOTE — Anesthesia Preprocedure Evaluation (Signed)
Anesthesia Evaluation  Name, MR# and DOB Patient awake  General Assessment Comment  Reviewed: Allergy & Precautions, H&P , Patient's Chart, lab work & pertinent test results  Airway Mallampati: II TM Distance: >3 FB Neck ROM: full    Dental No notable dental hx.    Pulmonary  clear to auscultation  pulmonary exam normalPulmonary Exam Normal breath sounds clear to auscultation none    Cardiovascular     Neuro/Psych Negative Psych ROS  GI/Hepatic/Renal negative GI ROS  negative Liver ROS  negative Renal ROS        Endo/Other  Negative Endocrine ROS (+)      Abdominal Normal abdominal exam  (+)   Musculoskeletal negative musculoskeletal ROS (+)   Hematology negative hematology ROS (+)   Peds  Reproductive/Obstetrics (+) Pregnancy    Anesthesia Other Findings             Anesthesia Physical Anesthesia Plan  ASA: III  Anesthesia Plan: Epidural   Post-op Pain Management:    Induction:   Airway Management Planned:   Additional Equipment:   Intra-op Plan:   Post-operative Plan:   Informed Consent: I have reviewed the patients History and Physical, chart, labs and discussed the procedure including the risks, benefits and alternatives for the proposed anesthesia with the patient or authorized representative who has indicated his/her understanding and acceptance.     Plan Discussed with:   Anesthesia Plan Comments:         Anesthesia Quick Evaluation

## 2011-05-23 LAB — CBC
Hemoglobin: 10.7 g/dL — ABNORMAL LOW (ref 12.0–15.0)
MCHC: 34.3 g/dL (ref 30.0–36.0)
RDW: 13.3 % (ref 11.5–15.5)
WBC: 10.6 10*3/uL — ABNORMAL HIGH (ref 4.0–10.5)

## 2011-05-23 MED ORDER — ACETAMINOPHEN 325 MG PO TABS
325.0000 mg | ORAL_TABLET | Freq: Four times a day (QID) | ORAL | Status: DC | PRN
  Administered 2011-05-23 – 2011-05-24 (×3): 325 mg via ORAL
  Filled 2011-05-23 (×3): qty 1

## 2011-05-23 NOTE — Progress Notes (Signed)
Post Partum Day 1 Subjective: no complaints, voiding and tolerating PO  Objective: Blood pressure 100/67, pulse 76, temperature 98 F (36.7 C), temperature source Oral, resp. rate 18, height 4\' 7"  (1.397 m), weight 74.844 kg (165 lb), last menstrual period 08/15/2010, SpO2 99.00%, unknown if currently breastfeeding.  Physical Exam:  General: alert Lochia: appropriate Uterine Fundus: firm I Basename 05/23/11 0510 05/22/11 0744  HGB 10.7* 12.7  HCT 31.2* 37.7    Assessment/Plan: Plan for discharge tomorrow   LOS: 1 day   Teresa Parrish W 05/23/2011, 8:56 AM

## 2011-05-23 NOTE — Anesthesia Postprocedure Evaluation (Signed)
Anesthesia Post Note  Patient: Teresa Parrish  Procedure(s) Performed: * No procedures listed *  Anesthesia type: Epidural  Patient location: Mother/Baby  Post pain: Pain level controlled  Post assessment: Post-op Vital signs reviewed  Last Vitals:  Filed Vitals:   05/23/11 0605  BP: 100/67  Pulse: 76  Temp: 98 F (36.7 C)  Resp: 18    Post vital signs: Reviewed  Level of consciousness: awake  Complications: No apparent anesthesia complications

## 2011-05-23 NOTE — Anesthesia Postprocedure Evaluation (Signed)
  Anesthesia Post-op Note  Patient: Teresa Parrish  Procedure(s) Performed: * No procedures listed *  Patient Location: PACU  Anesthesia Type: Epidural  Level of Consciousness: awake, alert  and patient cooperative  Airway and Oxygen Therapy: Patient Spontanous Breathing  Post-op Pain: none  Post-op Assessment: Post-op Vital signs reviewed, Patient's Cardiovascular Status Stable, Respiratory Function Stable, Patent Airway, No signs of Nausea or vomiting, Adequate PO intake and Pain level controlled  Post-op Vital Signs: Reviewed and stable  Complications: No apparent anesthesia complications

## 2011-05-24 LAB — CBC
HCT: 30.5 — ABNORMAL LOW
Hemoglobin: 10.8 — ABNORMAL LOW
Hemoglobin: 13.9
MCV: 89.8
RBC: 4.5
WBC: 10.4
WBC: 16.1 — ABNORMAL HIGH

## 2011-05-24 LAB — RPR: RPR Ser Ql: NONREACTIVE

## 2011-05-24 MED ORDER — IBUPROFEN 600 MG PO TABS: 600.0000 mg | ORAL_TABLET | Freq: Four times a day (QID) | ORAL | Status: AC

## 2011-05-24 NOTE — Discharge Summary (Signed)
Obstetric Discharge Summary Reason for Admission: induction of labor Prenatal Procedures: none Intrapartum Procedures: spontaneous vaginal delivery Postpartum Procedures: none Complications-Operative and Postpartum: 2nd degree perineal laceration Hemoglobin  Date Value Range Status  05/23/2011 10.7* 12.0-15.0 (g/dL) Final     HCT  Date Value Range Status  05/23/2011 31.2* 36.0-46.0 (%) Final    Discharge Diagnoses: Term Pregnancy-delivered  Discharge Information: Date: 05/24/2011 Activity: pelvic rest Diet: routine Medications: Ibuprofen Condition: stable Instructions: refer to practice specific booklet Discharge to: home Follow-up Information    Follow up with Dorothie Wah W. Make an appointment in 6 weeks.   Contact information:   [address] [place] [address] [phone]          Newborn Data: Live born female  Birth Weight: 8 lb 5 oz (3771 g) APGAR: 9, 9  Home with mother.  Teresa Parrish 05/24/2011, 9:56 AM

## 2011-05-24 NOTE — Progress Notes (Signed)
Post Partum Day 2 Subjective: no complaints  Objective: Blood pressure 103/68, pulse 76, temperature 98 F (36.7 C), temperature source Oral, resp. rate 18, height 4\' 7"  (1.397 m), weight 74.844 kg (165 lb), last menstrual period 08/15/2010, SpO2 99.00%, unknown if currently breastfeeding.  Physical Exam:  General: alert Lochia: appropriate Uterine Fundus: firm I Basename 05/23/11 0510 05/22/11 0744  HGB 10.7* 12.7  HCT 31.2* 37.7    Assessment/Plan: Discharge home F/u office in 6 weeks Motrin, Tylenol   LOS: 2 days   Moriya Mitchell W 05/24/2011, 9:53 AM

## 2011-12-01 ENCOUNTER — Encounter: Payer: Self-pay | Admitting: Family Medicine

## 2011-12-01 ENCOUNTER — Ambulatory Visit (INDEPENDENT_AMBULATORY_CARE_PROVIDER_SITE_OTHER): Payer: BC Managed Care – PPO | Admitting: Family Medicine

## 2011-12-01 VITALS — BP 100/70 | HR 76 | Temp 98.4°F | Wt 122.0 lb

## 2011-12-01 DIAGNOSIS — J329 Chronic sinusitis, unspecified: Secondary | ICD-10-CM

## 2011-12-01 MED ORDER — AMOXICILLIN-POT CLAVULANATE 875-125 MG PO TABS: 1.0000 | ORAL_TABLET | Freq: Two times a day (BID) | ORAL | Status: AC

## 2011-12-01 NOTE — Progress Notes (Signed)
SUBJECTIVE:  Teresa Parrish is a 31 y.o. female who complains of coryza, congestion, sore throat and bilateral sinus pain for 8 days. She denies a history of anorexia, chest pain, chills, dizziness and fatigue and denies a history of asthma. Patient denies smoke cigarettes.  Symptoms were improving until last few days, now sinus pain and tooth pain has progressed.   OBJECTIVE: BP 100/70  Pulse 76  Temp(Src) 98.4 F (36.9 C) (Oral)  Wt 122 lb (55.339 kg)  Breastfeeding? Yes  She appears well, vital signs are as noted. Ears normal.  Throat and pharynx normal.  Neck supple. No adenopathy in the neck. Nose is congested. Sinuses  Tender to palpation throughout. The chest is clear, without wheezes or rales.  ASSESSMENT:  sinusitis  PLAN: Given duration and progression of symptoms, will treat for bacterial sinusitis with Augmentin. Symptomatic therapy suggested: push fluids, rest and return office visit prn if symptoms persist or worsen.  Call or return to clinic prn if these symptoms worsen or fail to improve as anticipated.

## 2011-12-01 NOTE — Patient Instructions (Signed)
It was good to see you. Take antibiotic as directed.  Drink lots of fluids.  Treat sympotmatically with Mucinex, nasal saline irrigation, and Tylenol/Ibuprofen.Cough suppressant at night. Call if not improving as expected in 5-7 days.

## 2012-06-19 IMAGING — NM NUCLEAR MEDICINE HEPATOHBILIARY INCLUDE GB
3 series · 21 of 21 positions shown · non-contrast
Comparison: none

REASON FOR EXAM: nausea bloating abdominal pain
COMMENTS:

[Series 1000: gallbladder statics · 4.80mm/px · 9 of 9 slices shown]
[im 1/9]
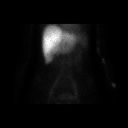
[im 2/9]
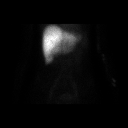
[im 3/9]
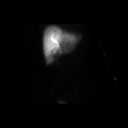
[im 4/9]
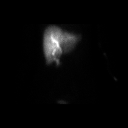
[im 5/9]
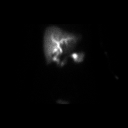
[im 6/9]
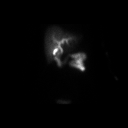
[im 7/9]
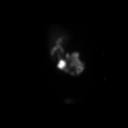
[im 8/9]
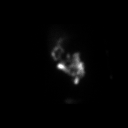
[im 9/9]
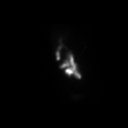

[Series 1000: gallbladder dynamic · 4.80mm/px · 6 of 60 frames shown]
[frame 6/60]
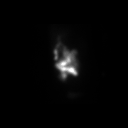
[frame 16/60]
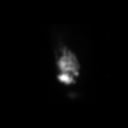
[frame 26/60]
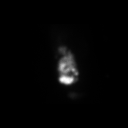
[frame 36/60]
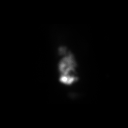
[frame 46/60]
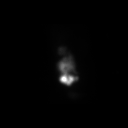
[frame 56/60]
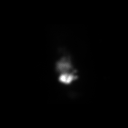

[Series 1000: gallbladder dynamic (results) · 4.80mm/px · 6 of 60 frames shown]
[frame 6/60]
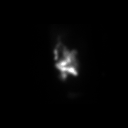
[frame 16/60]
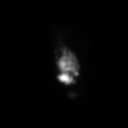
[frame 26/60]
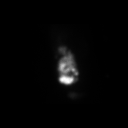
[frame 36/60]
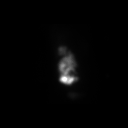
[frame 46/60]
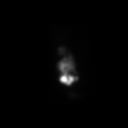
[frame 56/60]
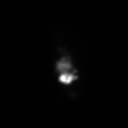

[21 of 21 positions shown; findings below may reference images not displayed]

PROCEDURE:     NM  - NM HEPATO WITH GB EJECT FRACTION  - June 21, 2010 [DATE]

RESULT:     Following intravenous administration of 7.95 mCi technetium 99m
Choletec, there is noted prompt visualization of tracer activity in the
liver at 3 minutes. At 30 minutes tracer activity is visualized in the
gallbladder, common duct and proximal small bowel.

The gallbladder fraction at 30 minutes measures 92% which is in the normal
range.
IMPRESSION: 1. Normal hepatobiliary scan.
2. The gallbladder ejection fraction measures 92% which is within the normal
range.

## 2014-06-29 ENCOUNTER — Encounter: Payer: Self-pay | Admitting: Family Medicine

## 2021-05-09 DIAGNOSIS — G5601 Carpal tunnel syndrome, right upper limb: Secondary | ICD-10-CM | POA: Diagnosis not present

## 2021-05-13 ENCOUNTER — Ambulatory Visit: Payer: Self-pay | Admitting: Family Medicine

## 2021-05-18 ENCOUNTER — Other Ambulatory Visit: Payer: Self-pay

## 2021-05-18 ENCOUNTER — Other Ambulatory Visit: Payer: Self-pay | Admitting: Podiatry

## 2021-05-18 ENCOUNTER — Ambulatory Visit: Payer: No Typology Code available for payment source | Admitting: Podiatry

## 2021-05-18 ENCOUNTER — Ambulatory Visit (INDEPENDENT_AMBULATORY_CARE_PROVIDER_SITE_OTHER): Payer: No Typology Code available for payment source

## 2021-05-18 ENCOUNTER — Encounter (INDEPENDENT_AMBULATORY_CARE_PROVIDER_SITE_OTHER): Payer: Self-pay

## 2021-05-18 DIAGNOSIS — M2142 Flat foot [pes planus] (acquired), left foot: Secondary | ICD-10-CM | POA: Diagnosis not present

## 2021-05-18 DIAGNOSIS — M722 Plantar fascial fibromatosis: Secondary | ICD-10-CM | POA: Diagnosis not present

## 2021-05-18 DIAGNOSIS — M2141 Flat foot [pes planus] (acquired), right foot: Secondary | ICD-10-CM

## 2021-05-18 DIAGNOSIS — M779 Enthesopathy, unspecified: Secondary | ICD-10-CM

## 2021-05-18 NOTE — Patient Instructions (Signed)
Shoe brands I like:  [employer], [address], [address], [employer]    Inserts: Powerstep, Superfeet, Protalus, Aetrex    Plantar Fasciitis (Heel Spur Syndrome) with Rehab The plantar fascia is a fibrous, ligament-like, soft-tissue structure that spans the bottom of the foot. Plantar fasciitis is a condition that causes pain in the foot due to inflammation of the tissue. SYMPTOMS  Pain and tenderness on the underneath side of the foot. Pain that worsens with standing or walking. CAUSES  Plantar fasciitis is caused by irritation and injury to the plantar fascia on the underneath side of the foot. Common mechanisms of injury include: Direct trauma to bottom of the foot. Damage to a small nerve that runs under the foot where the main fascia attaches to the heel bone. Stress placed on the plantar fascia due to bone spurs. RISK INCREASES WITH:  Activities that place stress on the plantar fascia (running, jumping, pivoting, or cutting). Poor strength and flexibility. Improperly fitted shoes. Tight calf muscles. Flat feet. Failure to warm-up properly before activity. Obesity. PREVENTION Warm up and stretch properly before activity. Allow for adequate recovery between workouts. Maintain physical fitness: Strength, flexibility, and endurance. Cardiovascular fitness. Maintain a health body weight. Avoid stress on the plantar fascia. Wear properly fitted shoes, including arch supports for individuals who have flat feet.  PROGNOSIS  If treated properly, then the symptoms of plantar fasciitis usually resolve without surgery. However, occasionally surgery is necessary.  RELATED COMPLICATIONS  Recurrent symptoms that may result in a chronic condition. Problems of the lower back that are caused by compensating for the injury, such as limping. Pain or weakness of the foot during push-off following surgery. Chronic inflammation, scarring, and partial or complete fascia tear,  occurring more often from repeated injections.  TREATMENT  Treatment initially involves the use of ice and medication to help reduce pain and inflammation. The use of strengthening and stretching exercises may help reduce pain with activity, especially stretches of the Achilles tendon. These exercises may be performed at home or with a therapist. Your caregiver may recommend that you use heel cups of arch supports to help reduce stress on the plantar fascia. Occasionally, corticosteroid injections are given to reduce inflammation. If symptoms persist for greater than 6 months despite non-surgical (conservative), then surgery may be recommended.   MEDICATION  If pain medication is necessary, then nonsteroidal anti-inflammatory medications, such as aspirin and ibuprofen, or other minor pain relievers, such as acetaminophen, are often recommended. Do not take pain medication within 7 days before surgery. Prescription pain relievers may be given if deemed necessary by your caregiver. Use only as directed and only as much as you need. Corticosteroid injections may be given by your caregiver. These injections should be reserved for the most serious cases, because they may only be given a certain number of times.  HEAT AND COLD Cold treatment (icing) relieves pain and reduces inflammation. Cold treatment should be applied for 10 to 15 minutes every 2 to 3 hours for inflammation and pain and immediately after any activity that aggravates your symptoms. Use ice packs or massage the area with a piece of ice (ice massage). Heat treatment may be used prior to performing the stretching and strengthening activities prescribed by your caregiver, physical therapist, or athletic trainer. Use a heat pack or soak the injury in warm water.  SEEK IMMEDIATE MEDICAL CARE IF: Treatment seems to offer no benefit, or the condition worsens. Any medications produce adverse side effects.  EXERCISES- RANGE OF  MOTION (ROM) AND  STRETCHING EXERCISES - Plantar Fasciitis (Heel Spur Syndrome) These exercises may help you when beginning to rehabilitate your injury. Your symptoms may resolve with or without further involvement from your physician, physical therapist or athletic trainer. While completing these exercises, remember:  Restoring tissue flexibility helps normal motion to return to the joints. This allows healthier, less painful movement and activity. An effective stretch should be held for at least 30 seconds. A stretch should never be painful. You should only feel a gentle lengthening or release in the stretched tissue.  RANGE OF MOTION - Toe Extension, Flexion Sit with your right / left leg crossed over your opposite knee. Grasp your toes and gently pull them back toward the top of your foot. You should feel a stretch on the bottom of your toes and/or foot. Hold this stretch for 10 seconds. Now, gently pull your toes toward the bottom of your foot. You should feel a stretch on the top of your toes and or foot. Hold this stretch for 10 seconds. Repeat  times. Complete this stretch 3 times per day.   RANGE OF MOTION - Ankle Dorsiflexion, Active Assisted Remove shoes and sit on a chair that is preferably not on a carpeted surface. Place right / left foot under knee. Extend your opposite leg for support. Keeping your heel down, slide your right / left foot back toward the chair until you feel a stretch at your ankle or calf. If you do not feel a stretch, slide your bottom forward to the edge of the chair, while still keeping your heel down. Hold this stretch for 10 seconds. Repeat 3 times. Complete this stretch 2 times per day.   STRETCH  Gastroc, Standing Place hands on wall. Extend right / left leg, keeping the front knee somewhat bent. Slightly point your toes inward on your back foot. Keeping your right / left heel on the floor and your knee straight, shift your weight toward the wall, not allowing your back  to arch. You should feel a gentle stretch in the right / left calf. Hold this position for 10 seconds. Repeat 3 times. Complete this stretch 2 times per day.  STRETCH  Soleus, Standing Place hands on wall. Extend right / left leg, keeping the other knee somewhat bent. Slightly point your toes inward on your back foot. Keep your right / left heel on the floor, bend your back knee, and slightly shift your weight over the back leg so that you feel a gentle stretch deep in your back calf. Hold this position for 10 seconds. Repeat 3 times. Complete this stretch 2 times per day.  STRETCH  Gastrocsoleus, Standing  Note: This exercise can place a lot of stress on your foot and ankle. Please complete this exercise only if specifically instructed by your caregiver.  Place the ball of your right / left foot on a step, keeping your other foot firmly on the same step. Hold on to the wall or a rail for balance. Slowly lift your other foot, allowing your body weight to press your heel down over the edge of the step. You should feel a stretch in your right / left calf. Hold this position for 10 seconds. Repeat this exercise with a slight bend in your right / left knee. Repeat 3 times. Complete this stretch 2 times per day.   STRENGTHENING EXERCISES - Plantar Fasciitis (Heel Spur Syndrome)  These exercises may help you when beginning to rehabilitate your injury. They may  resolve your symptoms with or without further involvement from your physician, physical therapist or athletic trainer. While completing these exercises, remember:  Muscles can gain both the endurance and the strength needed for everyday activities through controlled exercises. Complete these exercises as instructed by your physician, physical therapist or athletic trainer. Progress the resistance and repetitions only as guided.  STRENGTH - Towel Curls Sit in a chair positioned on a non-carpeted surface. Place your foot on a towel,  keeping your heel on the floor. Pull the towel toward your heel by only curling your toes. Keep your heel on the floor. Repeat 3 times. Complete this exercise 2 times per day.  STRENGTH - Ankle Inversion Secure one end of a rubber exercise band/tubing to a fixed object (table, pole). Loop the other end around your foot just before your toes. Place your fists between your knees. This will focus your strengthening at your ankle. Slowly, pull your big toe up and in, making sure the band/tubing is positioned to resist the entire motion. Hold this position for 10 seconds. Have your muscles resist the band/tubing as it slowly pulls your foot back to the starting position. Repeat 3 times. Complete this exercises 2 times per day.  Document Released: 08/14/2005 Document Revised: 11/06/2011 Document Reviewed: 11/26/2008 [facility] Patient Information 2014 [place], [place].

## 2021-05-19 ENCOUNTER — Encounter: Payer: Self-pay | Admitting: Podiatry

## 2021-05-19 NOTE — Progress Notes (Signed)
  Subjective:  Patient ID: Teresa Parrish, female    DOB: 21-Feb-1981,  MRN: [id number]  Chief Complaint  Patient presents with   Foot Pain    Patient presents today for bilat forefoot/arch pain    40 y.o. female presents with the above complaint. History confirmed with patient.  The right side has been going off and on for a year and the left side started about a week ago.  Feels a sharp stabbing pain in the middle of the arch.  It throbs and comes and goes.  Very intermittent.  It is worse with walking or standing especially when she gets started walking.  She is tried kinesiotaping and icing and ibuprofen which has been somewhat helpful  Objective:  Physical Exam: warm, good capillary refill, no trophic changes or ulcerative lesions, normal DP and PT pulses, and normal sensory exam.  Bilateral she has flexible mild pes planus deformity on weightbearing Left Foot: point tenderness of the mid plantar fascia Right Foot: point tenderness of the mid plantar fascia  No images are attached to the encounter.  Radiographs: Multiple views x-ray of both feet: no fracture, dislocation, swelling or degenerative changes noted and mild pes planus deformity Assessment:   1. Plantar fasciitis   2. Pes planus of both feet      Plan:  Patient was evaluated and treated and all questions answered.  Discussed the etiology and treatment options for plantar fasciitis including stretching, formal physical therapy, supportive shoegears such as a running shoe or sneaker, pre fabricated orthoses, injection therapy, and oral medications. We also discussed the role of surgical treatment of this for patients who do not improve after exhausting non-surgical treatment options.   -XR reviewed with patient -Educated patient on stretching and icing of the affected limb -Recommend OTC NSAIDs as needed for this. -Also discussed and reviewed her x-rays and discussed how her pes planus and plantar fasciitis are  interrelated.  We discussed shoe gear changes including more supportive shoes which she will get.  This is not successful we will add a prefabricated insole for this that she can get here or online.  We also discussed custom molded orthoses but I do not think to be necessary at this point.   Return if symptoms worsen or fail to improve.

## 2021-06-22 DIAGNOSIS — Z681 Body mass index (BMI) 19 or less, adult: Secondary | ICD-10-CM | POA: Diagnosis not present

## 2021-06-22 DIAGNOSIS — Z0000 Encounter for general adult medical examination without abnormal findings: Secondary | ICD-10-CM | POA: Diagnosis not present

## 2021-06-22 DIAGNOSIS — Z13220 Encounter for screening for lipoid disorders: Secondary | ICD-10-CM | POA: Diagnosis not present

## 2021-06-22 DIAGNOSIS — N920 Excessive and frequent menstruation with regular cycle: Secondary | ICD-10-CM | POA: Diagnosis not present

## 2021-06-22 DIAGNOSIS — R69 Illness, unspecified: Secondary | ICD-10-CM | POA: Diagnosis not present

## 2021-06-22 DIAGNOSIS — Z01411 Encounter for gynecological examination (general) (routine) with abnormal findings: Secondary | ICD-10-CM | POA: Diagnosis not present

## 2021-06-22 DIAGNOSIS — Z124 Encounter for screening for malignant neoplasm of cervix: Secondary | ICD-10-CM | POA: Diagnosis not present

## 2021-06-22 DIAGNOSIS — Z01419 Encounter for gynecological examination (general) (routine) without abnormal findings: Secondary | ICD-10-CM | POA: Diagnosis not present

## 2021-08-19 DIAGNOSIS — R0982 Postnasal drip: Secondary | ICD-10-CM | POA: Diagnosis not present

## 2021-08-19 DIAGNOSIS — R059 Cough, unspecified: Secondary | ICD-10-CM | POA: Diagnosis not present

## 2021-08-19 DIAGNOSIS — R0981 Nasal congestion: Secondary | ICD-10-CM | POA: Diagnosis not present

## 2021-08-19 DIAGNOSIS — J0140 Acute pansinusitis, unspecified: Secondary | ICD-10-CM | POA: Diagnosis not present

## 2024-05-13 ENCOUNTER — Ambulatory Visit: Payer: Self-pay

## 2024-05-13 DIAGNOSIS — K635 Polyp of colon: Secondary | ICD-10-CM | POA: Diagnosis not present

## 2024-05-13 DIAGNOSIS — Z1211 Encounter for screening for malignant neoplasm of colon: Secondary | ICD-10-CM | POA: Diagnosis present
